# Patient Record
Sex: Female | Born: 1955 | Race: White | Hispanic: No | Marital: Married | Smoking: Former smoker
Health system: Southern US, Community
[De-identification: ages and names within clinical notes are randomized; demographics above are authoritative.]

---

## 1985-09-02 HISTORY — PX: TUBAL LIGATION: SHX77

## 2007-09-02 ENCOUNTER — Ambulatory Visit: Payer: Self-pay | Admitting: Family Medicine

## 2014-10-30 ENCOUNTER — Emergency Department: Payer: Self-pay | Admitting: Emergency Medicine

## 2018-02-03 ENCOUNTER — Ambulatory Visit: Payer: Self-pay | Admitting: Nurse Practitioner

## 2018-02-23 ENCOUNTER — Ambulatory Visit: Payer: Commercial Managed Care - PPO | Admitting: Nurse Practitioner

## 2018-02-23 ENCOUNTER — Encounter: Payer: Self-pay | Admitting: Nurse Practitioner

## 2018-02-23 VITALS — BP 137/79 | HR 66 | Resp 16 | Ht 65.0 in | Wt 166.8 lb

## 2018-02-23 DIAGNOSIS — Z1231 Encounter for screening mammogram for malignant neoplasm of breast: Secondary | ICD-10-CM

## 2018-02-23 DIAGNOSIS — R590 Localized enlarged lymph nodes: Secondary | ICD-10-CM | POA: Diagnosis not present

## 2018-02-23 DIAGNOSIS — E559 Vitamin D deficiency, unspecified: Secondary | ICD-10-CM

## 2018-02-23 DIAGNOSIS — Z0001 Encounter for general adult medical examination with abnormal findings: Secondary | ICD-10-CM | POA: Diagnosis not present

## 2018-02-23 DIAGNOSIS — Z1239 Encounter for other screening for malignant neoplasm of breast: Secondary | ICD-10-CM

## 2018-02-23 NOTE — Progress Notes (Signed)
[facility] [address] [place], [place] [address]  Internal MEDICINE  Office Visit Note  Patient Name: Bailey Smith  [id number]  [id number]  Date of Service: 03/18/2018  Pt is here for establishment of PCP.  Chief Complaint  Patient presents with  . Neck Pain    small lump o nback of left neck she is concerned about .     The patient has small, tender mass present on back of her neck. She has noted this for past few weeks. `no fever or chills. Does have mild fatigue.    Current Medication: Outpatient Encounter Medications as of 02/23/2018  Medication Sig  . ibuprofen (ADVIL,MOTRIN) 200 MG tablet Take 200 mg by mouth 3 times/day as needed-between meals & bedtime.  . phentermine (ADIPEX-P) 37.5 MG tablet Take 37.5 mg by mouth daily.   No facility-administered encounter medications on file as of 02/23/2018.     Surgical History: Past Surgical History:  Procedure Laterality Date  . TUBAL LIGATION  1987    Medical History: No past medical history on file.  Family History: Family History  Problem Relation Age of Onset  . Kidney failure Mother   . Hypertension Mother     Social History   Socioeconomic History  . Marital status: Married    Spouse name: Not on file  . Number of children: Not on file  . Years of education: Not on file  . Highest education level: Not on file  Occupational History  . Not on file  Social Needs  . Financial resource strain: Not on file  . Food insecurity:    Worry: Not on file    Inability: Not on file  . Transportation needs:    Medical: Not on file    Non-medical: Not on file  Tobacco Use  . Smoking status: Former Smoker    Years: 25.00  . Smokeless tobacco: Never Used  Substance and Sexual Activity  . Alcohol use: Yes    Comment: ocassional  . Drug use: Never  . Sexual activity: Yes  Lifestyle  . Physical activity:    Days per week: Not on file    Minutes per session: Not on file  . Stress: Not on file   Relationships  . Social connections:    Talks on phone: Not on file    Gets together: Not on file    Attends religious service: Not on file    Active member of club or organization: Not on file    Attends meetings of clubs or organizations: Not on file    Relationship status: Not on file  . Intimate partner violence:    Fear of current or ex partner: Not on file    Emotionally abused: Not on file    Physically abused: Not on file    Forced sexual activity: Not on file  Other Topics Concern  . Not on file  Social History Narrative  . Not on file     Review of Systems  Constitutional: Negative for activity change, chills, fatigue, fever and unexpected weight change.  HENT: Negative for congestion, postnasal drip, rhinorrhea, sneezing and sore throat.   Eyes: Negative.  Negative for redness.  Respiratory: Negative for cough, chest tightness, shortness of breath and wheezing.   Cardiovascular: Negative for chest pain and palpitations.  Gastrointestinal: Negative for abdominal pain, constipation, diarrhea, nausea and vomiting.  Endocrine: Negative for cold intolerance, heat intolerance, polydipsia, polyphagia and polyuria.  Genitourinary: Negative.  Negative for dysuria and  frequency.  Musculoskeletal: Positive for neck pain. Negative for arthralgias, back pain and joint swelling.  Skin: Negative for rash.  Allergic/Immunologic: Negative for environmental allergies.  Neurological: Negative for dizziness, tremors, numbness and headaches.  Hematological: Positive for adenopathy. Does not bruise/bleed easily.  Psychiatric/Behavioral: Negative for behavioral problems (Depression), sleep disturbance and suicidal ideas. The patient is not nervous/anxious.     Today's Vitals   02/23/18 1527  BP: 137/79  Pulse: 66  Resp: 16  SpO2: 99%  Weight: 166 lb 12.8 oz (75.7 kg)  Height: 5\' 5"  (1.651 m)    Physical Exam  Constitutional: She is oriented to person, place, and time. She  appears well-developed and well-nourished. No distress.  HENT:  Head: Normocephalic and atraumatic.  Nose: Nose normal.  Mouth/Throat: Oropharynx is clear and moist. No oropharyngeal exudate.  Eyes: Pupils are equal, round, and reactive to light. Conjunctivae and EOM are normal.  Neck: Normal range of motion. Neck supple. No JVD present. No tracheal deviation present. No thyromegaly present.  Small, palpable lymph nodes present on posterior neck. About 2cm in diameter with smooth edges. Slightly tender with no warmth or evidence of infection or inflammation.   Cardiovascular: Normal rate, regular rhythm and normal heart sounds. Exam reveals no gallop and no friction rub.  No murmur heard. Pulmonary/Chest: Effort normal and breath sounds normal. No respiratory distress. She has no wheezes. She has no rales. She exhibits no tenderness.  Abdominal: Soft. Bowel sounds are normal. There is no tenderness.  Musculoskeletal: Normal range of motion.  Lymphadenopathy:    She has cervical adenopathy.  Neurological: She is alert and oriented to person, place, and time. No cranial nerve deficit.  Skin: Skin is warm and dry. She is not diaphoretic.  Psychiatric: She has a normal mood and affect. Her behavior is normal. Judgment and thought content normal.  Nursing note and vitals reviewed.  Assessment/Plan: 1. Cervical lymphadenopathy Most likely benign, reactive lymph node. Will monitor closely   2. Vitamin D deficiency - Vitamin D 1,25 dihydroxy  3. Screening for breast cancer - MM 3D SCREEN BREAST BILATERAL; Future  4. Encounter for general adult medical examination with abnormal findings - CBC with Differential/Platelet - Comprehensive metabolic panel - T4, free - TSH - Lipid panel  General Counseling: peggyann zwiefelhofer understanding of the findings of todays visit and agrees with plan of treatment. I have discussed any further diagnostic evaluation that may be needed or ordered today. We  also reviewed her medications today. she has been encouraged to call the office with any questions or concerns that should arise related to todays visit.    Counseling:  This patient was seen by Vincent Gros FNP Collaboration with Dr Lyndon Code as a part of collaborative care agreement  Orders Placed This Encounter  Procedures  . MM 3D SCREEN BREAST BILATERAL  . CBC with Differential/Platelet  . Comprehensive metabolic panel  . T4, free  . TSH  . Lipid panel  . Vitamin D 1,25 dihydroxy      Time spent: 25 Minutes

## 2018-03-18 ENCOUNTER — Encounter: Payer: Self-pay | Admitting: Nurse Practitioner

## 2018-03-18 DIAGNOSIS — Z0001 Encounter for general adult medical examination with abnormal findings: Secondary | ICD-10-CM | POA: Insufficient documentation

## 2018-03-18 DIAGNOSIS — E559 Vitamin D deficiency, unspecified: Secondary | ICD-10-CM | POA: Insufficient documentation

## 2018-03-18 DIAGNOSIS — R590 Localized enlarged lymph nodes: Secondary | ICD-10-CM | POA: Insufficient documentation

## 2018-03-30 ENCOUNTER — Ambulatory Visit
Admission: RE | Admit: 2018-03-30 | Discharge: 2018-03-30 | Disposition: A | Discharge status: Home / Self Care | Payer: Commercial Managed Care - PPO | Source: Ambulatory Visit | Attending: Nurse Practitioner | Admitting: Nurse Practitioner

## 2018-03-30 ENCOUNTER — Encounter: Payer: Self-pay | Admitting: Radiology

## 2018-03-30 DIAGNOSIS — Z1231 Encounter for screening mammogram for malignant neoplasm of breast: Secondary | ICD-10-CM | POA: Diagnosis present

## 2018-03-30 DIAGNOSIS — Z1239 Encounter for other screening for malignant neoplasm of breast: Secondary | ICD-10-CM

## 2018-03-30 LAB — LIPID PANEL
Chol/HDL Ratio: 4.5 ratio — ABNORMAL HIGH (ref 0.0–4.4)
Cholesterol, Total: 208 mg/dL — ABNORMAL HIGH (ref 100–199)
HDL: 46 mg/dL (ref >39)
LDL CALC: 86 mg/dL (ref 0–99)
Triglycerides: 378 mg/dL — ABNORMAL HIGH (ref 0–149)
VLDL CHOLESTEROL CAL: 76 mg/dL — AB (ref 5–40)

## 2018-03-30 LAB — CBC WITH DIFFERENTIAL/PLATELET
BASOS: 1 %
Basophils Absolute: 0 10*3/uL (ref 0.0–0.2)
EOS (ABSOLUTE): 0.1 10*3/uL (ref 0.0–0.4)
Eos: 2 %
Hematocrit: 45.1 % (ref 34.0–46.6)
Hemoglobin: 15.2 g/dL (ref 11.1–15.9)
Immature Grans (Abs): 0 10*3/uL (ref 0.0–0.1)
Immature Granulocytes: 0 %
Lymphocytes Absolute: 1.3 10*3/uL (ref 0.7–3.1)
Lymphs: 32 %
MCH: 30.6 pg (ref 26.6–33.0)
MCHC: 33.7 g/dL (ref 31.5–35.7)
MCV: 91 fL (ref 79–97)
MONOS ABS: 0.3 10*3/uL (ref 0.1–0.9)
Monocytes: 8 %
Neutrophils Absolute: 2.4 10*3/uL (ref 1.4–7.0)
Neutrophils: 57 %
PLATELETS: 233 10*3/uL (ref 150–450)
RBC: 4.97 x10E6/uL (ref 3.77–5.28)
RDW: 13.1 % (ref 12.3–15.4)
WBC: 4.1 10*3/uL (ref 3.4–10.8)

## 2018-03-30 LAB — COMPREHENSIVE METABOLIC PANEL
ALK PHOS: 72 IU/L (ref 39–117)
ALT: 21 IU/L (ref 0–32)
AST: 20 IU/L (ref 0–40)
Albumin/Globulin Ratio: 1.7 (ref 1.2–2.2)
Albumin: 4.6 g/dL (ref 3.6–4.8)
BUN/Creatinine Ratio: 16 (ref 12–28)
BUN: 16 mg/dL (ref 8–27)
Bilirubin Total: 1.4 mg/dL — ABNORMAL HIGH (ref 0.0–1.2)
CO2: 24 mmol/L (ref 20–29)
Calcium: 9.4 mg/dL (ref 8.7–10.3)
Chloride: 102 mmol/L (ref 96–106)
Creatinine, Ser: 1.01 mg/dL — ABNORMAL HIGH (ref 0.57–1.00)
GFR calc Af Amer: 68 mL/min/{1.73_m2} (ref >59)
GFR, EST NON AFRICAN AMERICAN: 59 mL/min/{1.73_m2} — AB (ref >59)
Globulin, Total: 2.7 g/dL (ref 1.5–4.5)
Glucose: 89 mg/dL (ref 65–99)
POTASSIUM: 4.4 mmol/L (ref 3.5–5.2)
Sodium: 141 mmol/L (ref 134–144)
Total Protein: 7.3 g/dL (ref 6.0–8.5)

## 2018-03-30 LAB — VITAMIN D 1,25 DIHYDROXY
VITAMIN D 1, 25 (OH) TOTAL: 27 pg/mL
Vitamin D3 1, 25 (OH)2: 25 pg/mL

## 2018-03-30 LAB — T4, FREE: Free T4: 1.03 ng/dL (ref 0.82–1.77)

## 2018-03-30 LAB — TSH: TSH: 2.87 u[IU]/mL (ref 0.450–4.500)

## 2018-03-31 ENCOUNTER — Other Ambulatory Visit: Payer: Self-pay | Admitting: Nurse Practitioner

## 2018-03-31 DIAGNOSIS — R928 Other abnormal and inconclusive findings on diagnostic imaging of breast: Secondary | ICD-10-CM

## 2018-03-31 DIAGNOSIS — N6489 Other specified disorders of breast: Secondary | ICD-10-CM

## 2018-04-02 ENCOUNTER — Other Ambulatory Visit: Payer: Self-pay | Admitting: Adult Health

## 2018-04-02 DIAGNOSIS — N6489 Other specified disorders of breast: Secondary | ICD-10-CM

## 2018-04-02 DIAGNOSIS — R928 Other abnormal and inconclusive findings on diagnostic imaging of breast: Secondary | ICD-10-CM

## 2018-04-08 ENCOUNTER — Ambulatory Visit
Admission: RE | Admit: 2018-04-08 | Discharge: 2018-04-08 | Disposition: A | Discharge status: Home / Self Care | Payer: Commercial Managed Care - PPO | Source: Ambulatory Visit | Attending: Adult Health | Admitting: Adult Health

## 2018-04-08 DIAGNOSIS — N6489 Other specified disorders of breast: Secondary | ICD-10-CM | POA: Diagnosis present

## 2018-04-08 DIAGNOSIS — R928 Other abnormal and inconclusive findings on diagnostic imaging of breast: Secondary | ICD-10-CM

## 2018-04-27 ENCOUNTER — Other Ambulatory Visit: Payer: Self-pay | Admitting: Nurse Practitioner

## 2018-05-25 ENCOUNTER — Other Ambulatory Visit: Payer: Self-pay | Admitting: Nurse Practitioner

## 2018-08-24 IMAGING — US US BREAST*L* LIMITED INC AXILLA
1 series · 12 of 15 positions shown · non-contrast
Comparison: Recent baseline screening mammogram dated 03/30/2018.

CLINICAL DATA: Patient returns today to evaluate a possible LEFT
breast asymmetry identified on recent baseline screening mammogram.

EXAM:
DIGITAL DIAGNOSTIC LEFT MAMMOGRAM WITH CAD AND TOMO
ULTRASOUND LEFT BREAST

[Series 1: us breast*left* limited inc axilla · 0.05mm/px · 12 of 15 slices shown]
[im 1/15]
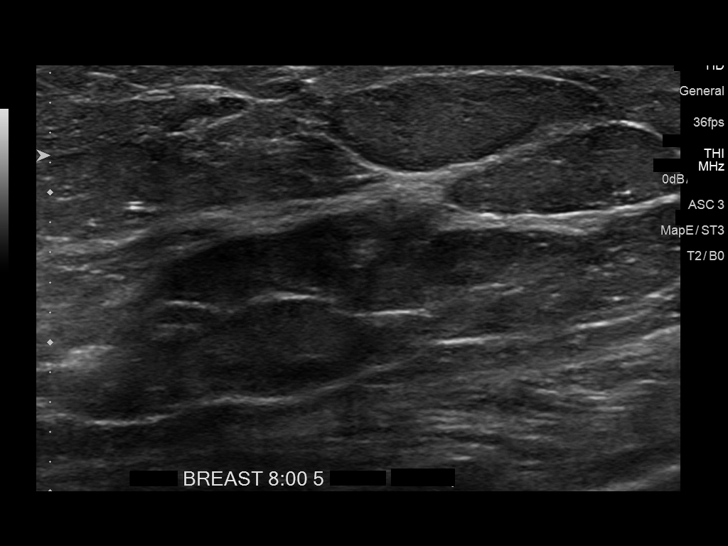
[im 2/15]
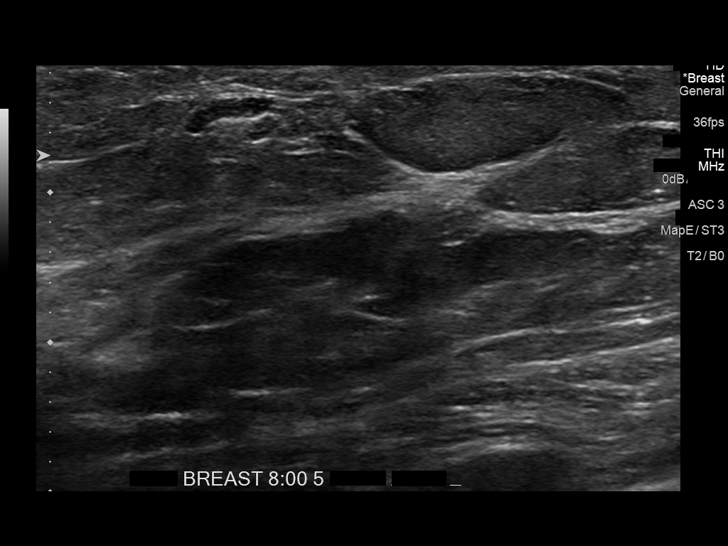
[im 4/15]
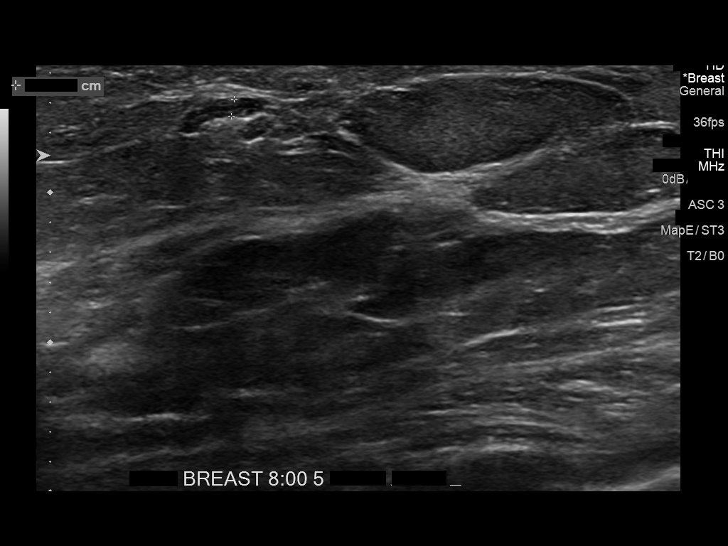
[im 5/15]
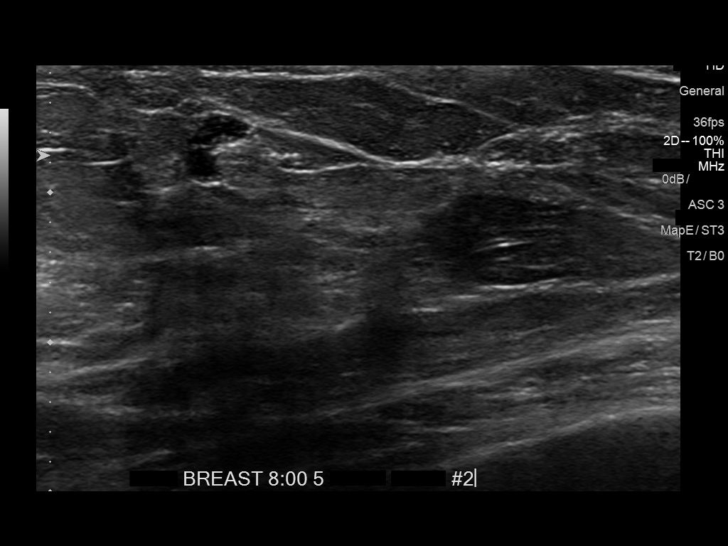
[im 6/15]
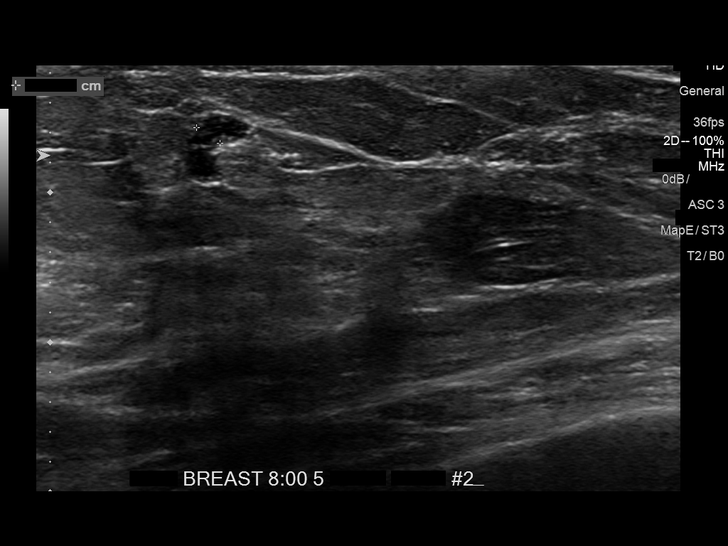
[im 7/15]
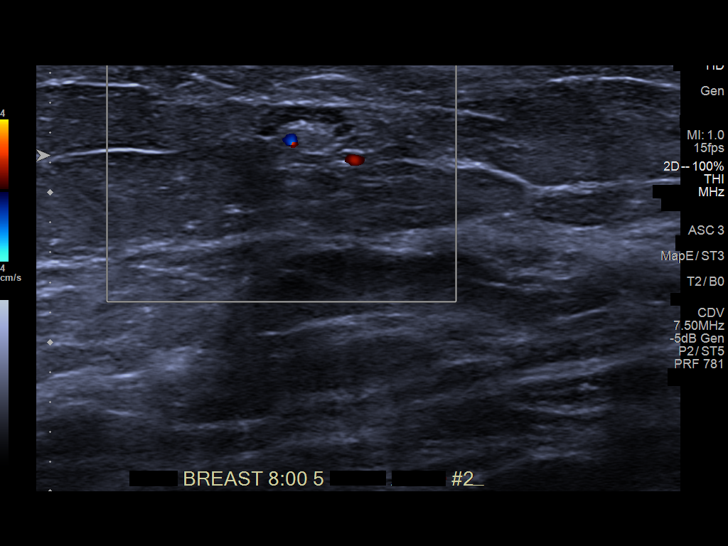
[im 9/15]
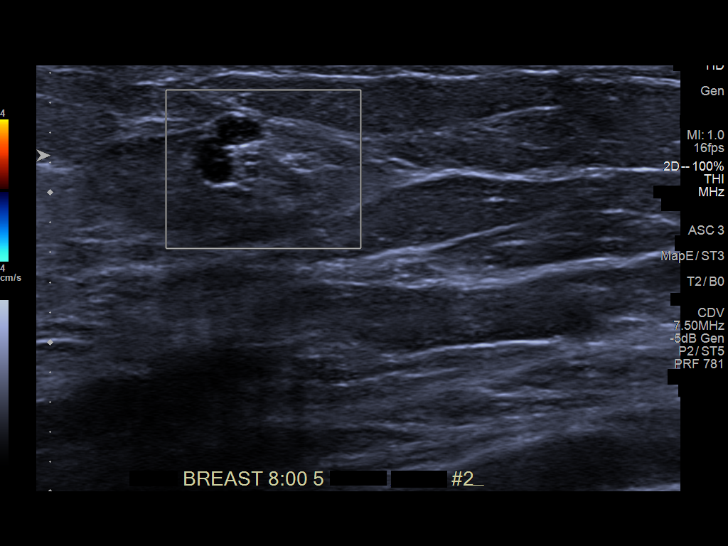
[im 10/15]
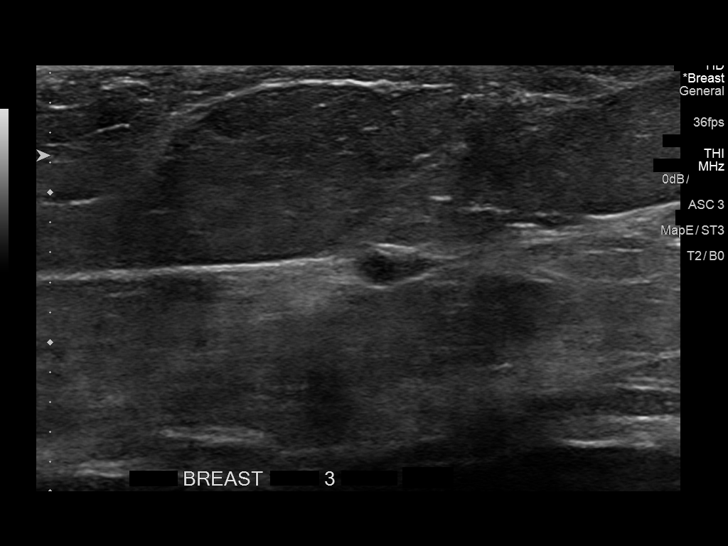
[im 11/15]
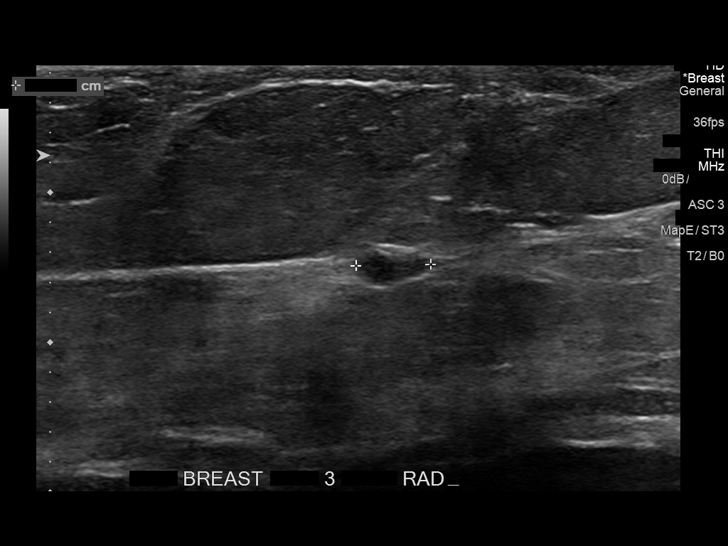
[im 12/15]
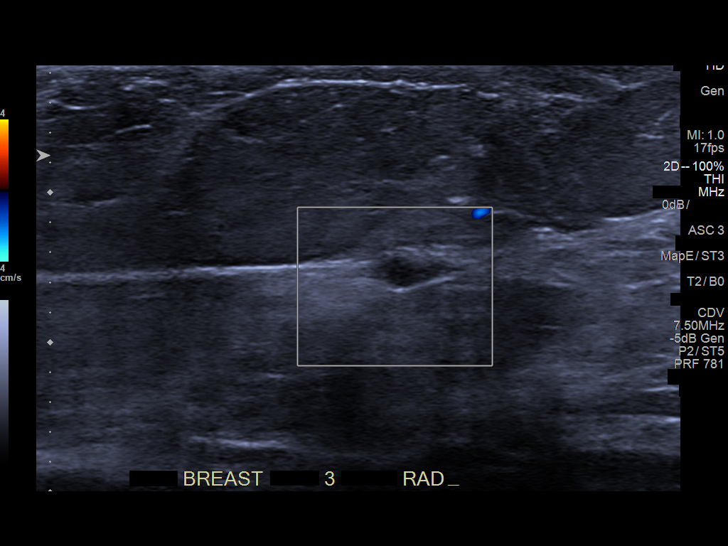
[im 14/15]
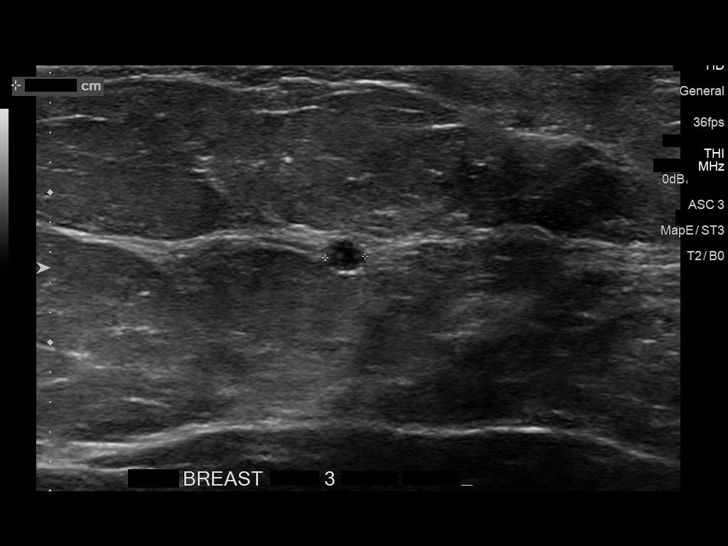
[im 15/15]
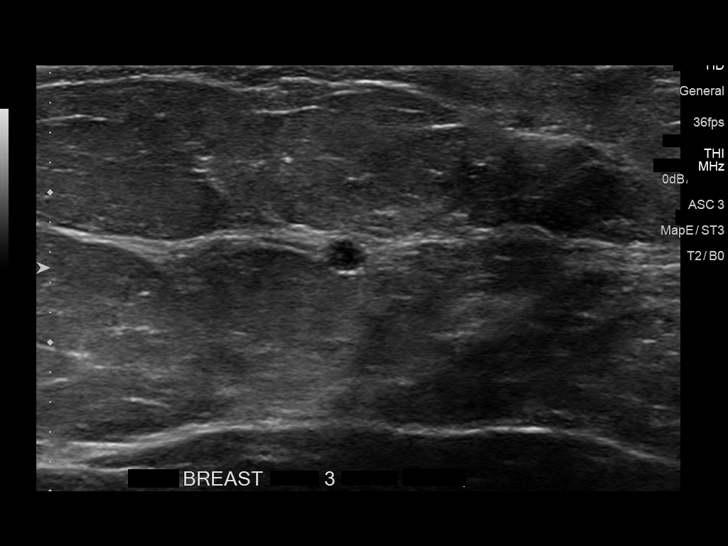

[12 of 15 positions shown; findings below may reference images not displayed]

ACR Breast Density Category b: There are scattered areas of
fibroglandular density.
FINDINGS: Two abutting oval circumscribed masses are confirmed within the
upper-outer quadrant of the LEFT breast, at superficial depth,
largest measuring approximately 7 mm.

There is an additional partially obscured mass within the upper LEFT
breast, at posterior depth, true lateral slice 24, measuring
approximately 4 mm greatest dimension, not seen on CC views, located
within the outer LEFT breast based on tomosynthesis slice position,
possible correlate within the outer LEFT breast on spot compression
CC slice 18 and exaggerated CC slice 21, possible correlate within
the outer LEFT breast on exaggerated CC slice 33.

Mammographic images were processed with CAD.

Targeted ultrasound is performed, showing 2 adjacent benign
intramammary lymph nodes in the outer LEFT breast, largest measuring
6 mm, corresponding to the mammographic finding.

There is an additional oval circumscribed nearly anechoic mass in
the LEFT breast at the 12 o'clock axis, 3 cm from the nipple,
measuring 5 x 3 mm, possible correlate for either the partially
obscured mass within the outer LEFT breast (exaggerated CC slice 33)
or the oval circumscribed mass within the outer LEFT breast
(exaggerated CC slice 21).

No suspicious solid or cystic mass is identified within the upper or
outer LEFT breast by ultrasound.
IMPRESSION: 1. Probably benign complicated cyst within the LEFT breast at the 12
o'clock axis, 3 cm from the nipple, measuring 5 mm, a probable
correlate for either the partially obscured mass seen within the
upper-outer LEFT breast by mammogram (true lateral slice 25;
exaggerated CC slice 33) or the oval circumscribed mass within the
outer LEFT breast (exaggerated CC slice 21). No suspicious solid or
cystic mass identified within the upper or outer LEFT breast by
ultrasound. Recommend follow-up LEFT breast diagnostic mammogram and
ultrasound in 6 months to ensure stability of the partially obscured
mass within the upper LEFT breast, the possible correlate partially
obscured mass within the outer LEFT breast, and the probably benign
cyst seen on ultrasound.
2. Benign intramammary lymph nodes within the outer LEFT breast,
largest measuring 6 mm, corresponding to the additional mammographic
findings.

RECOMMENDATION:
LEFT breast diagnostic mammogram and ultrasound in 6 months.

I have discussed the findings and recommendations with the patient.
Results were also provided in writing at the conclusion of the
visit. If applicable, a reminder letter will be sent to the patient
regarding the next appointment.

BI-RADS CATEGORY  3: Probably benign.

## 2018-09-01 ENCOUNTER — Other Ambulatory Visit: Payer: Self-pay | Admitting: Nurse Practitioner

## 2019-05-06 DIAGNOSIS — K625 Hemorrhage of anus and rectum: Secondary | ICD-10-CM | POA: Diagnosis not present

## 2019-05-06 DIAGNOSIS — Z1211 Encounter for screening for malignant neoplasm of colon: Secondary | ICD-10-CM | POA: Diagnosis not present

## 2019-05-06 DIAGNOSIS — K580 Irritable bowel syndrome with diarrhea: Secondary | ICD-10-CM | POA: Diagnosis not present

## 2020-04-14 DIAGNOSIS — J3489 Other specified disorders of nose and nasal sinuses: Secondary | ICD-10-CM | POA: Diagnosis not present

## 2020-05-02 DIAGNOSIS — R52 Pain, unspecified: Secondary | ICD-10-CM | POA: Diagnosis not present

## 2020-05-02 DIAGNOSIS — J029 Acute pharyngitis, unspecified: Secondary | ICD-10-CM | POA: Diagnosis not present

## 2020-05-02 DIAGNOSIS — B349 Viral infection, unspecified: Secondary | ICD-10-CM | POA: Diagnosis not present

## 2022-01-07 DIAGNOSIS — H269 Unspecified cataract: Secondary | ICD-10-CM | POA: Diagnosis not present

## 2023-12-16 DIAGNOSIS — H269 Unspecified cataract: Secondary | ICD-10-CM | POA: Diagnosis not present

## 2023-12-16 DIAGNOSIS — H02832 Dermatochalasis of right lower eyelid: Secondary | ICD-10-CM | POA: Diagnosis not present
